# Patient Record
Sex: Male | Born: 2000 | Race: Black or African American | Hispanic: No | Marital: Single | Smoking: Never smoker
Health system: Southern US, Community
[De-identification: ages and names within clinical notes are randomized; demographics above are authoritative.]

## PROBLEM LIST (undated history)

## (undated) DIAGNOSIS — F909 Attention-deficit hyperactivity disorder, unspecified type: Secondary | ICD-10-CM

## (undated) DIAGNOSIS — Z9109 Other allergy status, other than to drugs and biological substances: Secondary | ICD-10-CM

## (undated) HISTORY — PX: MYRINGOTOMY: SUR874

---
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
## 2022-06-07 ENCOUNTER — Ambulatory Visit (HOSPITAL_COMMUNITY)
Admission: EM | Admit: 2022-06-07 | Discharge: 2022-06-07 | Disposition: A | Discharge status: Home / Self Care | Payer: Medicaid Other | Attending: Emergency Medicine | Admitting: Emergency Medicine

## 2022-06-07 ENCOUNTER — Encounter (HOSPITAL_COMMUNITY): Payer: Self-pay

## 2022-06-07 DIAGNOSIS — J029 Acute pharyngitis, unspecified: Secondary | ICD-10-CM

## 2022-06-07 LAB — POCT RAPID STREP A, ED / UC: Streptococcus, Group A Screen (Direct): NEGATIVE

## 2022-06-07 NOTE — ED Provider Notes (Signed)
[facility]    CSN: [id number] Arrival date & time: 06/07/22  1042      History   Chief Complaint Chief Complaint  Patient presents with   Sore Throat    HPI Cory Perkins is a 22 y.o. male.   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  Patient has been using warm salt water gargles and over-the-counter throat spray without relief.  No known illness exposure  The history is provided by the patient. No language interpreter was used.    Past Medical History:  Diagnosis Date   ADHD (attention deficit hyperactivity disorder)    Environmental allergies     Patient Active Problem List   Diagnosis Date Noted   Pharyngitis 06/07/2022   Learning problem 10/15/2013   ADHD (attention deficit hyperactivity disorder), combined type 10/12/2013   Adjustment disorder with mixed anxiety and depressed mood 10/12/2013    Past Surgical History:  Procedure Laterality Date   MYRINGOTOMY         Home Medications    Prior to Admission medications   Medication Sig Start Date End Date Taking? Authorizing Provider  albuterol (PROVENTIL HFA;VENTOLIN HFA) 108 (90 BASE) MCG/ACT inhaler Inhale 1 puff into the lungs every 6 (six) hours as needed for wheezing or shortness of breath.    [provider]  Methylphenidate HCl ER 25 MG/5ML SUSR Take 2.5 ml by mouth every morning.  May go up to 3 ml in the morning. 05/21/14   Gwynne Edinger, MD  Olopatadine HCl (PATADAY) 0.2 % SOLN Place 1 drop into both eyes daily as needed (for itchy eyes).    [provider]    Family History History reviewed. No pertinent family history.  Social History Social History   Tobacco Use   Smoking status: Never    Passive exposure: Yes  Vaping Use   Vaping Use: Never used  Substance Use Topics   Drug use: Never     Allergies   2,4-d dimethylamine   Review of Systems Review of Systems  Constitutional:   Negative for fever.  HENT:  Positive for sore throat.   Respiratory:  Negative for cough.   All other systems reviewed and are negative.    Physical Exam Triage Vital Signs ED Triage Vitals  Enc Vitals Group     BP 06/07/22 1101 127/81     Pulse Rate 06/07/22 1101 85     Resp 06/07/22 1101 16     Temp 06/07/22 1101 98 F (36.7 C)     Temp Source 06/07/22 1101 Oral     SpO2 06/07/22 1101 95 %     Weight --      Height --      Head Circumference --      Peak Flow --      Pain Score 06/07/22 1102 8     Pain Loc --      Pain Edu? --      Excl. in [place]? --    No data found.  Updated Vital Signs BP 127/81 (BP Location: Left Arm)   Pulse 85   Temp 98 F (36.7 C) (Oral)   Resp 16   SpO2 95%   Visual Acuity Right Eye Distance:   Left Eye Distance:   Bilateral Distance:    Right Eye Near:   Left Eye Near:    Bilateral Near:     Physical Exam Vitals and nursing note reviewed.  Constitutional:      General: He is not in acute distress.    Appearance: Normal appearance. He is well-developed and well-groomed. He is not ill-appearing.  HENT:     Head: Normocephalic.     Jaw: No trismus.     Right Ear: No hemotympanum. Tympanic membrane is retracted.     Left Ear: No hemotympanum. Tympanic membrane is retracted.     Nose: Mucosal edema present. No nasal deformity or septal deviation.     Mouth/Throat:     Lips: Pink.     Mouth: Mucous membranes are moist.     Pharynx: Uvula midline. Posterior oropharyngeal erythema present. No oropharyngeal exudate or uvula swelling.     Tonsils: No tonsillar abscesses.  Eyes:     General: Lids are normal.        Right eye: No discharge.        Left eye: No discharge.     Extraocular Movements: Extraocular movements intact.     Conjunctiva/sclera: Conjunctivae normal.     Right eye: Right conjunctiva is not injected. No hemorrhage.    Left eye: Left conjunctiva is not injected. No hemorrhage.    Pupils: Pupils are equal, round, and  reactive to light. Pupils are equal.     Right eye: Pupil is round and reactive.     Left eye: Pupil is round and reactive.  Neck:     Trachea: Trachea normal. No tracheal deviation.     Meningeal: Brudzinski's sign and Kernig's sign absent.  Cardiovascular:     Rate and Rhythm: Normal rate and regular rhythm.     Pulses: Normal pulses.     Heart sounds: Normal heart sounds.  Pulmonary:     Effort: Pulmonary effort is normal.     Breath sounds: Normal breath sounds and air entry.  Musculoskeletal:        General: Normal range of motion.     Cervical back: Normal range of motion.  Lymphadenopathy:     Cervical: No cervical adenopathy.  Skin:    General: Skin is warm and dry.  Neurological:     General: No focal deficit present.     Mental Status: He is alert and oriented to person, place, and time.     GCS: GCS eye subscore is 4. GCS verbal subscore is 5. GCS motor subscore is 6.     Cranial Nerves: No cranial nerve deficit.     Sensory: No sensory deficit.  Psychiatric:        Attention and Perception: Attention normal.        Mood and Affect: Mood normal.        Speech: Speech normal.        Behavior: Behavior is cooperative.      UC Treatments / Results  Labs (all labs ordered are listed, but only abnormal results are displayed) Labs Reviewed  CULTURE, GROUP A STREP [facility])  POCT RAPID STREP A, ED / UC    EKG   Radiology No results found.  Procedures Procedures (including critical care time)  Medications Ordered in UC Medications - No data to display  Initial Impression / Assessment and Plan / UC Course  I have reviewed the triage vital signs and the nursing notes.  Pertinent labs & imaging results that were available during my care of the patient were reviewed by me and considered in my medical decision making (see chart for details).     Ddx: Pharyngitis, strep, viral, allergies Final Clinical Impressions(s) /  UC Diagnoses   Final diagnoses:   Pharyngitis, unspecified etiology     Discharge Instructions      Your strep test is negative, culture pending. Most likely you have a viral illness: no antibiotic as indicated at this time, May treat with OTC meds of choice. Make sure to drink plenty of fluids to stay hydrated(gatorade, water, popsicles,jello,etc), avoid caffeine products. Follow up with PCP. Return as needed.     ED Prescriptions   None    PDMP not reviewed this encounter.   Tori Milks, NP [phone]

## 2022-06-07 NOTE — ED Triage Notes (Signed)
Patient c/o sore throat since yesterday.  Patient states he has been using salt water gargles and sore throat spray OTC.

## 2022-06-07 NOTE — Discharge Instructions (Addendum)
Your strep test is negative, culture pending. Most likely you have a viral illness: no antibiotic as indicated at this time, May treat with OTC meds of choice. Make sure to drink plenty of fluids to stay hydrated(gatorade, water, popsicles,jello,etc), avoid caffeine products. Follow up with PCP. Return as needed.

## 2022-06-08 LAB — CULTURE, GROUP A STREP (THRC)

## 2023-01-29 ENCOUNTER — Encounter (HOSPITAL_COMMUNITY): Payer: Self-pay

## 2023-01-29 ENCOUNTER — Ambulatory Visit (HOSPITAL_COMMUNITY)
Admission: EM | Admit: 2023-01-29 | Discharge: 2023-01-29 | Disposition: A | Discharge status: Home / Self Care | Payer: Medicaid Other | Attending: Family Medicine | Admitting: Family Medicine

## 2023-01-29 DIAGNOSIS — J4521 Mild intermittent asthma with (acute) exacerbation: Secondary | ICD-10-CM | POA: Diagnosis not present

## 2023-01-29 MED ORDER — ALBUTEROL SULFATE HFA 108 (90 BASE) MCG/ACT IN AERS: 2.0000 | INHALATION_SPRAY | RESPIRATORY_TRACT | 0 refills | Status: AC | PRN

## 2023-01-29 MED ORDER — PREDNISONE 20 MG PO TABS: 40.0000 mg | ORAL_TABLET | Freq: Every day | ORAL | 0 refills | Status: AC

## 2023-01-29 MED ORDER — BENZONATATE 100 MG PO CAPS: 100.0000 mg | ORAL_CAPSULE | Freq: Three times a day (TID) | ORAL | 0 refills | Status: AC | PRN

## 2023-01-29 NOTE — Discharge Instructions (Signed)
Albuterol inhaler--do 2 puffs every 4 hours as needed for shortness of breath or wheezing  Take prednisone 20 mg--2 daily for 5 days; this is for inflammation in your lungs  Take benzonatate 100 mg, 1 tab every 8 hours as needed for cough.

## 2023-01-29 NOTE — ED Triage Notes (Signed)
Patient here today with c/o cough, SOB, wheeze, ST, nasal drainage, and chest congestions X 6 days. He has been taking Sudafed and using cough drops with some relief. No sick contacts.

## 2023-01-29 NOTE — ED Provider Notes (Signed)
[facility]    CSN: [id number] Arrival date & time: 01/29/23  0911      History   Chief Complaint Chief Complaint  Patient presents with   Cough    HPI Cory Perkins is a 22 y.o. male.    Cough Here for cough and shortness of breath. About 6-7 days ago he began having cough and congestion. Then on 11/4, the congestion improved, but since then has had shortness of breath and cough. No f/c. No v/d.  Has had a h/o asthma. Had not had to use his inhaler for close to a year, until this past few days, and now using several times a day.   Past Medical History:  Diagnosis Date   ADHD (attention deficit hyperactivity disorder)    Environmental allergies     Patient Active Problem List   Diagnosis Date Noted   Pharyngitis 06/07/2022   Learning problem 10/15/2013   ADHD (attention deficit hyperactivity disorder), combined type 10/12/2013   Adjustment disorder with mixed anxiety and depressed mood 10/12/2013    Past Surgical History:  Procedure Laterality Date   MYRINGOTOMY         Home Medications    Prior to Admission medications   Medication Sig Start Date End Date Taking? Authorizing Provider  albuterol (VENTOLIN HFA) 108 (90 Base) MCG/ACT inhaler Inhale 2 puffs into the lungs every 4 (four) hours as needed for wheezing or shortness of breath. 01/29/23  Yes Zenia Resides, MD  benzonatate (TESSALON) 100 MG capsule Take 1 capsule (100 mg total) by mouth 3 (three) times daily as needed for cough. 01/29/23  Yes Zenia Resides, MD  predniSONE (DELTASONE) 20 MG tablet Take 2 tablets (40 mg total) by mouth daily with breakfast for 5 days. 01/29/23 02/03/23 Yes Raziya Aveni, Janace Aris, MD  Olopatadine HCl (PATADAY) 0.2 % SOLN Place 1 drop into both eyes daily as needed (for itchy eyes).    [provider]    Family History Family History  Problem Relation Age of Onset   Asthma Mother    Asthma Father     Social History Social History    Tobacco Use   Smoking status: Never    Passive exposure: Yes  Vaping Use   Vaping status: Never Used  Substance Use Topics   Alcohol use: Never   Drug use: Never     Allergies   2,4-d dimethylamine   Review of Systems Review of Systems  Respiratory:  Positive for cough.      Physical Exam Triage Vital Signs ED Triage Vitals  Encounter Vitals Group     BP 01/29/23 1115 116/78     Systolic BP Percentile --      Diastolic BP Percentile --      Pulse Rate 01/29/23 1115 98     Resp 01/29/23 1115 16     Temp 01/29/23 1115 98.7 F (37.1 C)     Temp Source 01/29/23 1115 Oral     SpO2 01/29/23 1115 94 %     Weight 01/29/23 1115 115 lb (52.2 kg)     Height 01/29/23 1115 5\' 3"  (1.6 m)     Head Circumference --      Peak Flow --      Pain Score 01/29/23 1114 6     Pain Loc --      Pain Education --      Exclude from Growth Chart --    No data found.  Updated Vital Signs BP 116/78 (  BP Location: Left Arm)   Pulse 98   Temp 98.7 F (37.1 C) (Oral)   Resp 16   Ht 5\' 3"  (1.6 m)   Wt 52.2 kg   SpO2 94%   BMI 20.37 kg/m   Visual Acuity Right Eye Distance:   Left Eye Distance:   Bilateral Distance:    Right Eye Near:   Left Eye Near:    Bilateral Near:     Physical Exam Vitals reviewed.  Constitutional:      General: He is not in acute distress.    Appearance: He is not ill-appearing, toxic-appearing or diaphoretic.  HENT:     Right Ear: Tympanic membrane and ear canal normal.     Left Ear: Tympanic membrane and ear canal normal.     Nose: Nose normal.     Mouth/Throat:     Mouth: Mucous membranes are moist.     Comments: Tonsils are hypertrophied about 2+ with significant mucus in the tonsillar crypts. Eyes:     Extraocular Movements: Extraocular movements intact.     Conjunctiva/sclera: Conjunctivae normal.     Pupils: Pupils are equal, round, and reactive to light.  Cardiovascular:     Rate and Rhythm: Normal rate and regular rhythm.     Heart  sounds: No murmur heard. Pulmonary:     Effort: Pulmonary effort is normal. No respiratory distress.     Breath sounds: No stridor. No wheezing, rhonchi or rales.  Musculoskeletal:     Cervical back: Neck supple.  Lymphadenopathy:     Cervical: No cervical adenopathy.  Skin:    Coloration: Skin is not jaundiced or pale.  Neurological:     General: No focal deficit present.     Mental Status: He is alert and oriented to person, place, and time.  Psychiatric:        Behavior: Behavior normal.      UC Treatments / Results  Labs (all labs ordered are listed, but only abnormal results are displayed) Labs Reviewed - No data to display  EKG   Radiology No results found.  Procedures Procedures (including critical care time)  Medications Ordered in UC Medications - No data to display  Initial Impression / Assessment and Plan / UC Course  I have reviewed the triage vital signs and the nursing notes.  Pertinent labs & imaging results that were available during my care of the patient were reviewed by me and considered in my medical decision making (see chart for details).     5-day burst of prednisone is sent in for the asthma exacerbation as his albuterol.  Tessalon Perles were sent in for the cough.   Final Clinical Impressions(s) / UC Diagnoses   Final diagnoses:  Mild intermittent asthma with exacerbation     Discharge Instructions      Albuterol inhaler--do 2 puffs every 4 hours as needed for shortness of breath or wheezing  Take prednisone 20 mg--2 daily for 5 days; this is for inflammation in your lungs  Take benzonatate 100 mg, 1 tab every 8 hours as needed for cough.      ED Prescriptions     Medication Sig Dispense Auth. Provider   albuterol (VENTOLIN HFA) 108 (90 Base) MCG/ACT inhaler Inhale 2 puffs into the lungs every 4 (four) hours as needed for wheezing or shortness of breath. 1 each Zenia Resides, MD   benzonatate (TESSALON) 100 MG  capsule Take 1 capsule (100 mg total) by mouth 3 (three) times daily as  needed for cough. 21 capsule Zenia Resides, MD   predniSONE (DELTASONE) 20 MG tablet Take 2 tablets (40 mg total) by mouth daily with breakfast for 5 days. 10 tablet Marlinda Mike Janace Aris, MD      PDMP not reviewed this encounter.   Zenia Resides, MD 01/29/23 [phone]

## 2023-12-21 IMAGING — US US SCROTUM W/ DOPPLER COMPLETE
1 series · 14 of 25 positions shown · non-contrast
Comparison: None Available.

CLINICAL DATA: Testicular pain and frequency

EXAM:
SCROTAL ULTRASOUND
DOPPLER ULTRASOUND OF THE TESTICLES
TECHNIQUE: Complete ultrasound examination of the testicles, epididymis, and
other scrotal structures was performed. Color and spectral Doppler
ultrasound were also utilized to evaluate blood flow to the
testicles.

[Series 1: us scrotum w/doppler · 14 of 68 slices shown]
[im 1/68]
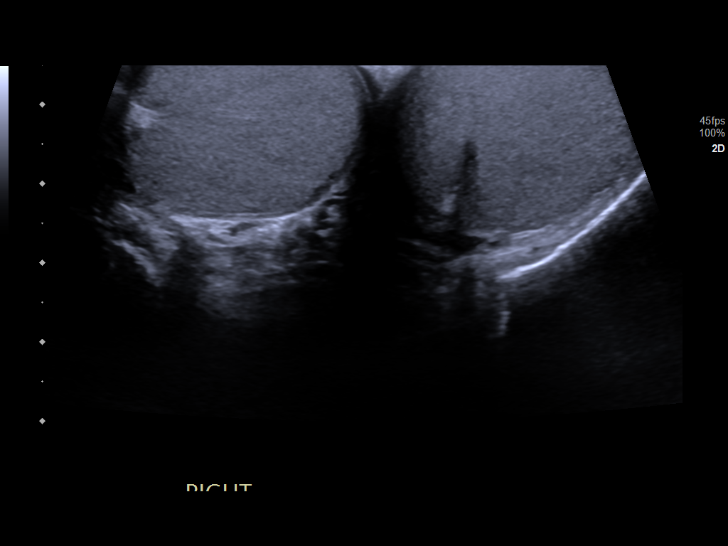
[im 6/68]
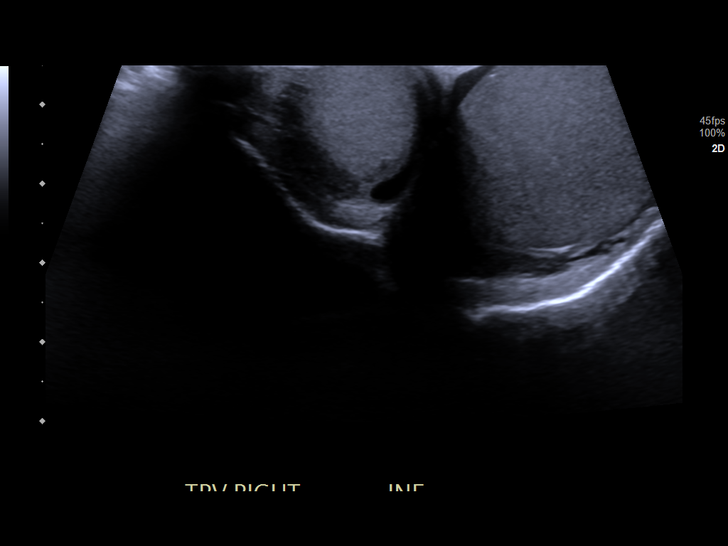
[im 12/68]
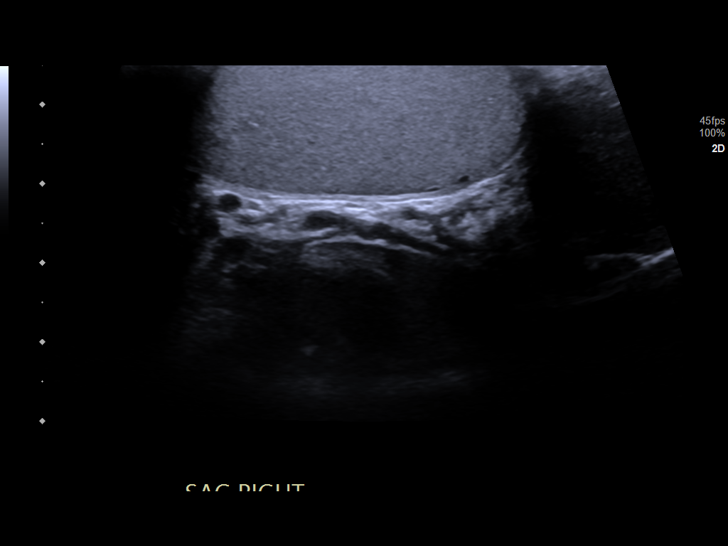
[im 17/68]
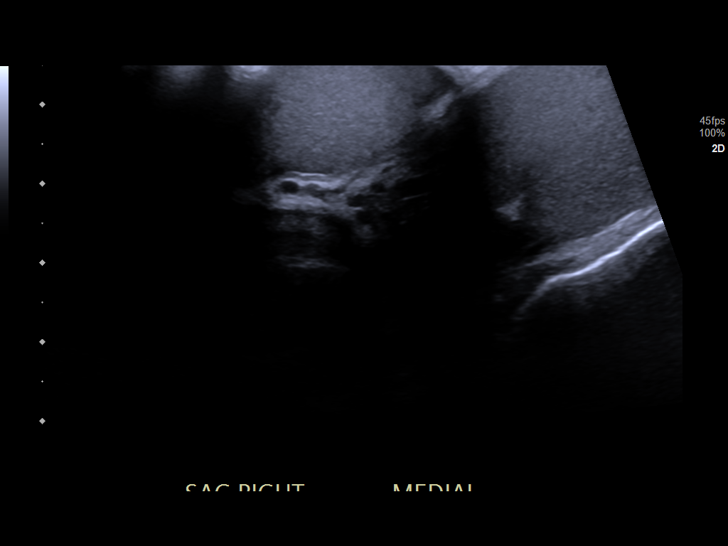
[im 23/68]
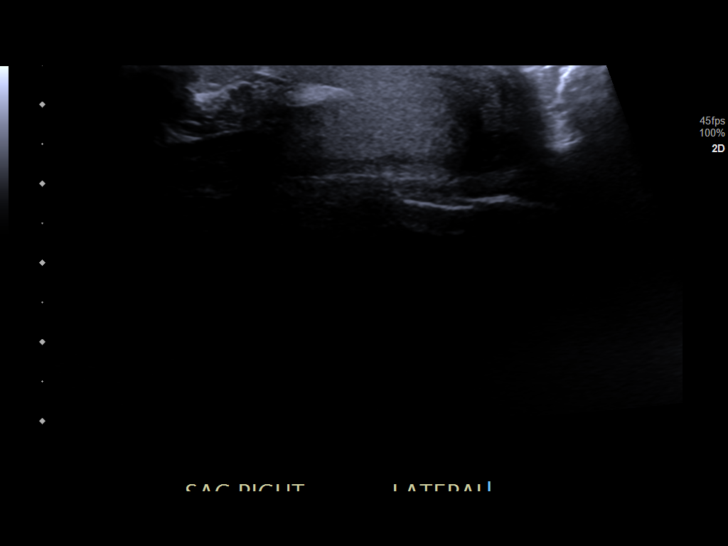
[im 26/68]
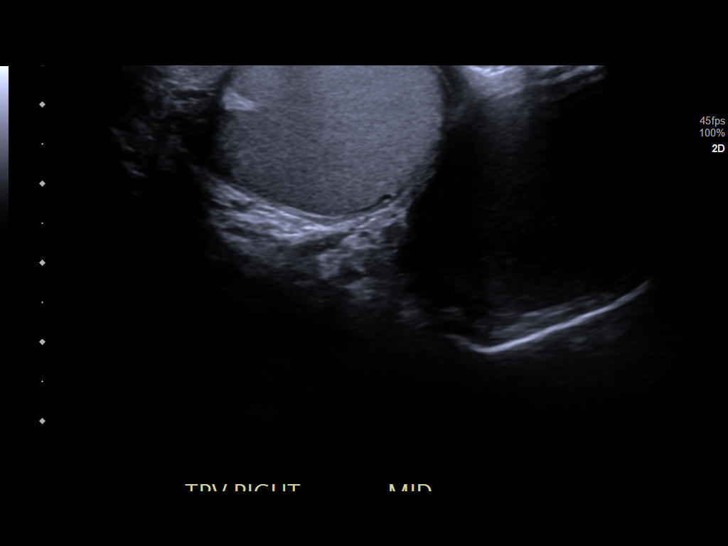
[im 31/68]
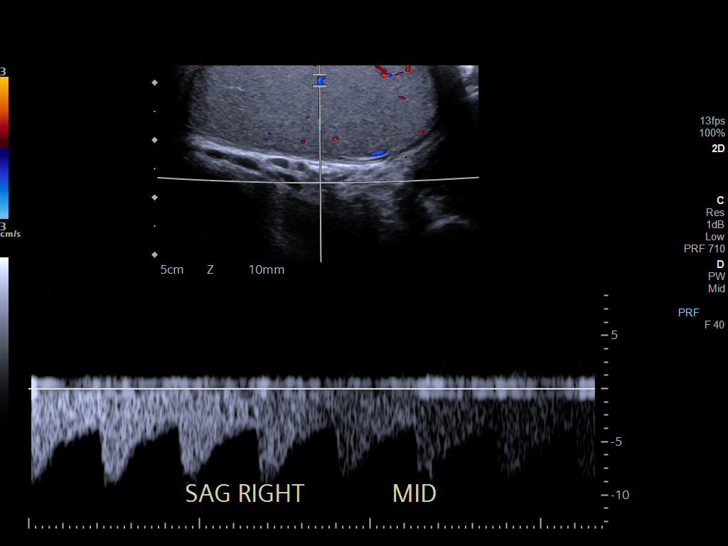
[im 37/68]
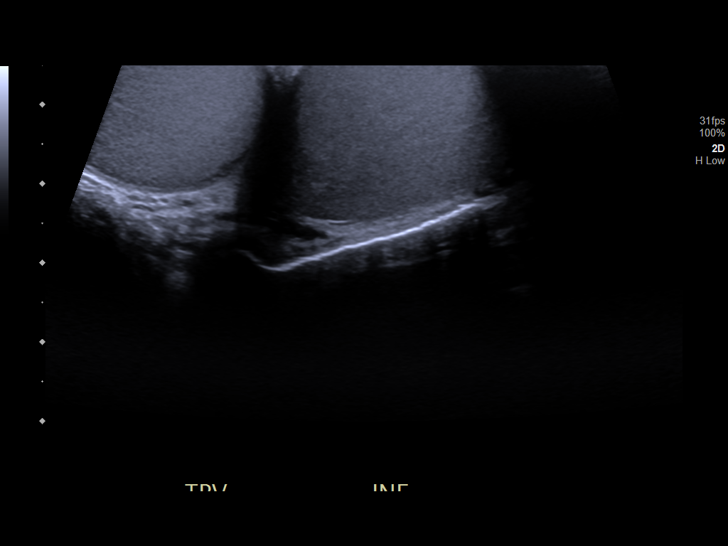
[im 42/68]
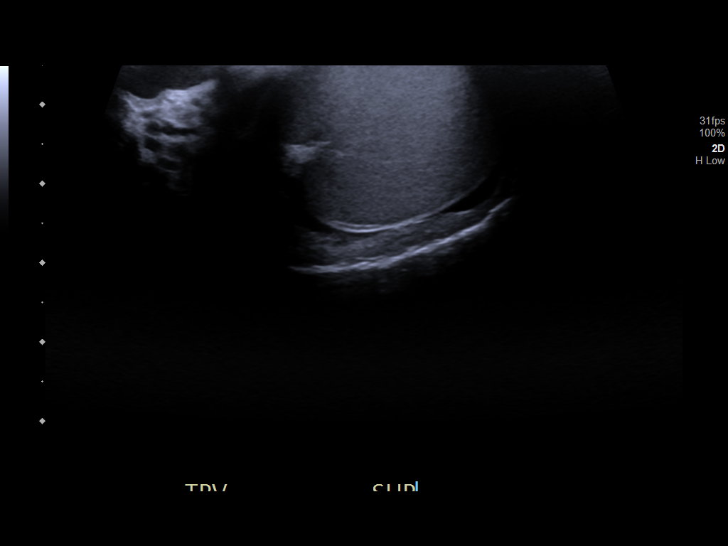
[im 45/68]
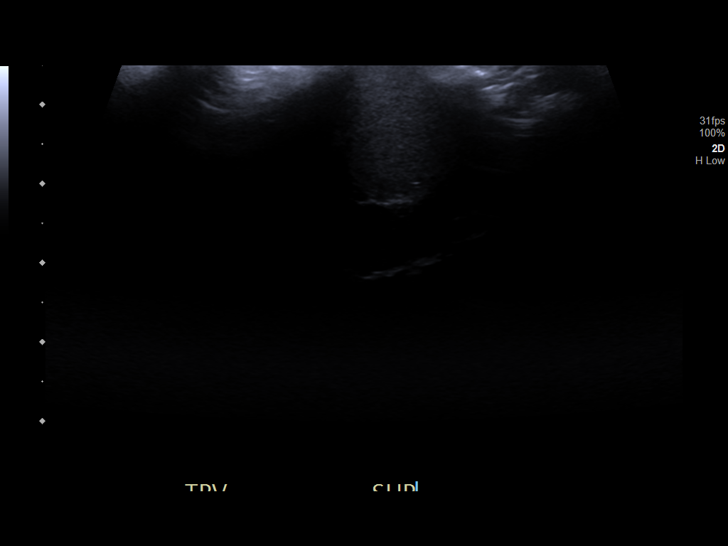
[im 51/68]
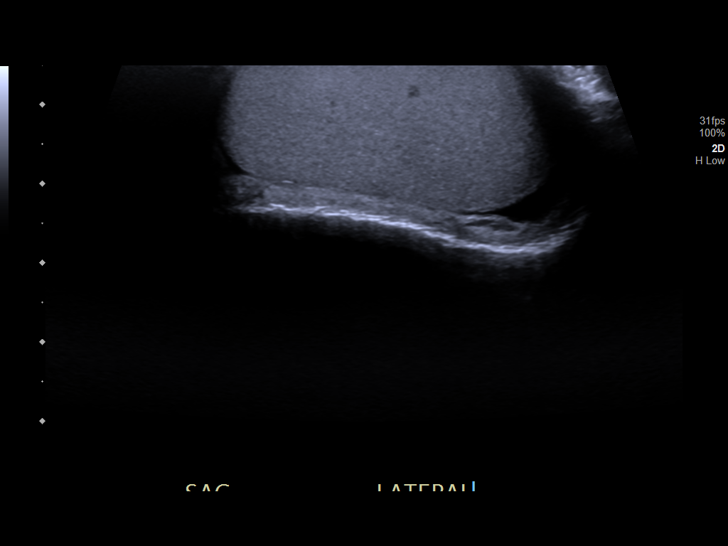
[im 56/68]
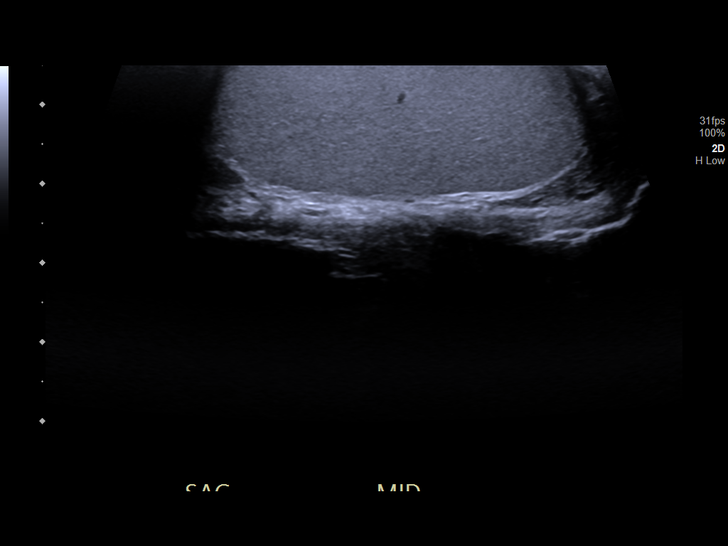
[im 62/68]
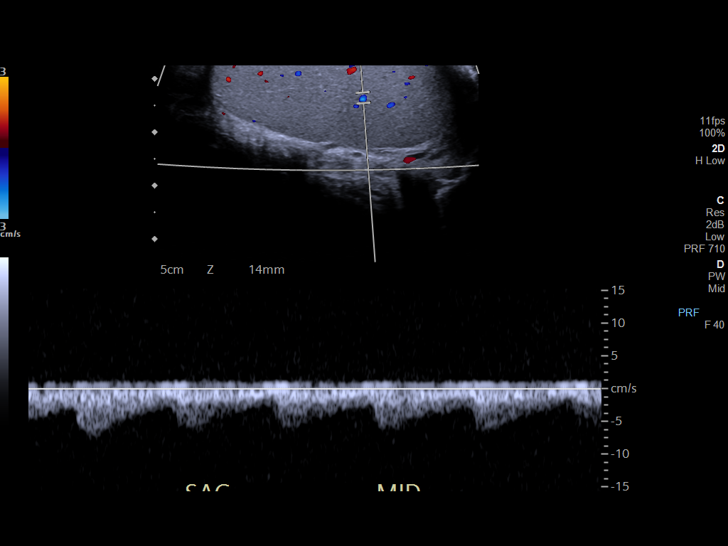
[im 68/68]
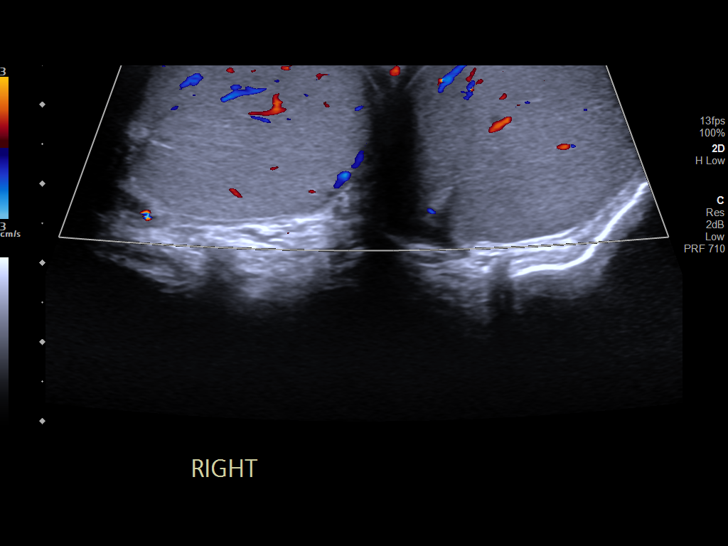

[14 of 25 positions shown; findings below may reference images not displayed]

FINDINGS: Right testicle

Measurements: 4.5 x 2.0 x 2.9 cm. No mass or microlithiasis
visualized.

Left testicle

Measurements: 4.7 x 1.9 x 3.0 cm. No mass or microlithiasis
visualized.

Right epididymis:  Normal in size and appearance.

Left epididymis:  Normal in size and appearance.

Hydrocele:  Minimal hydroceles are noted bilaterally.

Varicocele:  None visualized.

Pulsed Doppler interrogation of both testes demonstrates normal low
resistance arterial and venous waveforms bilaterally.
IMPRESSION: No testicular torsion is noted.

Minimal hydroceles are noted bilaterally.
